# Patient Record
Sex: Male | Born: 1980 | Race: White | Hispanic: No | Marital: Married | Smoking: Never smoker
Health system: Southern US, Community
[De-identification: ages and names within clinical notes are randomized; demographics above are authoritative.]

---

## 2008-10-18 ENCOUNTER — Ambulatory Visit (HOSPITAL_COMMUNITY): Admission: RE | Admit: 2008-10-18 | Discharge: 2008-10-18 | Payer: Self-pay | Admitting: Surgery

## 2008-10-21 ENCOUNTER — Encounter: Admission: RE | Admit: 2008-10-21 | Discharge: 2009-01-19 | Payer: Self-pay | Admitting: Surgery

## 2008-10-25 ENCOUNTER — Ambulatory Visit (HOSPITAL_COMMUNITY): Admission: RE | Admit: 2008-10-25 | Discharge: 2008-10-25 | Payer: Self-pay | Admitting: Surgery

## 2009-03-27 ENCOUNTER — Encounter: Admission: RE | Admit: 2009-03-27 | Discharge: 2009-05-07 | Payer: Self-pay | Admitting: Surgery

## 2009-04-09 HISTORY — PX: LAPAROSCOPIC GASTRIC BANDING: SHX1100

## 2009-04-21 ENCOUNTER — Ambulatory Visit (HOSPITAL_COMMUNITY): Admission: RE | Admit: 2009-04-21 | Discharge: 2009-04-21 | Payer: Self-pay | Admitting: Surgery

## 2009-06-05 ENCOUNTER — Encounter: Admission: RE | Admit: 2009-06-05 | Discharge: 2009-09-03 | Payer: Self-pay | Admitting: Surgery

## 2010-08-11 LAB — DIFFERENTIAL
Basophils Relative: 1 % (ref 0–1)
Eosinophils Absolute: 0.1 10*3/uL (ref 0.0–0.7)
Monocytes Absolute: 0.5 10*3/uL (ref 0.1–1.0)
Monocytes Relative: 6 % (ref 3–12)
Neutrophils Relative %: 66 % (ref 43–77)

## 2010-08-11 LAB — COMPREHENSIVE METABOLIC PANEL
Albumin: 3.8 g/dL (ref 3.5–5.2)
Alkaline Phosphatase: 69 U/L (ref 39–117)
BUN: 11 mg/dL (ref 6–23)
Chloride: 101 mEq/L (ref 96–112)
Potassium: 4.6 mEq/L (ref 3.5–5.1)
Total Bilirubin: 1.1 mg/dL (ref 0.3–1.2)

## 2010-08-11 LAB — CBC
Hemoglobin: 14.2 g/dL (ref 13.0–17.0)
RBC: 5 MIL/uL (ref 4.22–5.81)
RDW: 14.5 % (ref 11.5–15.5)
WBC: 8.6 10*3/uL (ref 4.0–10.5)

## 2010-10-06 ENCOUNTER — Encounter (INDEPENDENT_AMBULATORY_CARE_PROVIDER_SITE_OTHER): Payer: Self-pay | Admitting: Surgery

## 2010-12-18 ENCOUNTER — Encounter (INDEPENDENT_AMBULATORY_CARE_PROVIDER_SITE_OTHER): Payer: Self-pay

## 2010-12-18 ENCOUNTER — Ambulatory Visit (INDEPENDENT_AMBULATORY_CARE_PROVIDER_SITE_OTHER): Payer: Managed Care, Other (non HMO) | Admitting: Physician Assistant

## 2010-12-18 VITALS — BP 140/90 | Ht 72.0 in | Wt 367.2 lb

## 2010-12-18 DIAGNOSIS — Z4651 Encounter for fitting and adjustment of gastric lap band: Secondary | ICD-10-CM

## 2010-12-18 NOTE — Progress Notes (Signed)
  HISTORY: Izear Pine is a 30 y.o.male who received an AP-Large lap-band in December 2010 by Dr. Daphine Deutscher. He comes in with a one month history of intolerance of solid food unless he drinks a hot liquid immediately beforehand. He's also begun gaining weight during that time. No issues previously.Marland Kitchen  VITAL SIGNS: Filed Vitals:   12/18/10 1336  BP: 140/90    PHYSICAL EXAM: Physical exam reveals a very well-appearing 30 y.o.male in no apparent distress Neurologic: Awake, alert, oriented Psych: Bright affect, conversant Respiratory: Breathing even and unlabored. No stridor or wheezing Abdomen: Soft, nontender, nondistended to palpation. Incisions well-healed. No incisional hernias. Port easily palpated. Extremities: Atraumatic, good range of motion.  ASSESMENT: 30 y.o.  male  s/p AP-Large lap-band.   PLAN: I removed 0.5 mL from the band in the hopes that this will alleviate his symptoms. We'll have him back in 1-2 months or sooner if necessary.

## 2010-12-18 NOTE — Patient Instructions (Signed)
Return in 1-2 months or sooner if necessary.

## 2012-03-02 ENCOUNTER — Ambulatory Visit (INDEPENDENT_AMBULATORY_CARE_PROVIDER_SITE_OTHER): Payer: Managed Care, Other (non HMO) | Admitting: Surgery

## 2012-03-02 ENCOUNTER — Ambulatory Visit
Admission: RE | Admit: 2012-03-02 | Discharge: 2012-03-02 | Disposition: A | Discharge status: Home / Self Care | Payer: Managed Care, Other (non HMO) | Source: Ambulatory Visit | Attending: Surgery | Admitting: Surgery

## 2012-03-02 ENCOUNTER — Encounter (INDEPENDENT_AMBULATORY_CARE_PROVIDER_SITE_OTHER): Payer: Self-pay | Admitting: Surgery

## 2012-03-02 ENCOUNTER — Telehealth (INDEPENDENT_AMBULATORY_CARE_PROVIDER_SITE_OTHER): Payer: Self-pay | Admitting: General Surgery

## 2012-03-02 VITALS — BP 126/84 | HR 76 | Temp 97.7°F | Resp 18 | Ht 73.0 in | Wt 376.2 lb

## 2012-03-02 DIAGNOSIS — N63 Unspecified lump in unspecified breast: Secondary | ICD-10-CM

## 2012-03-02 DIAGNOSIS — Z9884 Bariatric surgery status: Secondary | ICD-10-CM

## 2012-03-02 DIAGNOSIS — R0602 Shortness of breath: Secondary | ICD-10-CM

## 2012-03-02 DIAGNOSIS — R1310 Dysphagia, unspecified: Secondary | ICD-10-CM

## 2012-03-02 NOTE — Progress Notes (Signed)
Devin Brennan 31 y.o.  Body mass index is 49.63 kg/(m^2).  Patient Active Problem List  Diagnosis  . 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 Laparoscopic gastric banding 04/2009   CAMERON,JOHN, MD 1. Lapband APL Dec 2010     [name] comes in today with a year long history of intermittent history of migratory chest pain with SOB.  He also has a palpable mass in his left breast medially at the 9 oclock position.  He is 38 lbs down from his preop weight and says that he has periods of obstructon and periods when he can eat ad lib.   I worry about DVT and occult PEs.  Will get venous doppler study.  Will check mammogram to assess the mass int he left breast and will get an upper GI to assess his lapband.  Will see back after these.  Matt B. Daphine Deutscher, MD, [facility] [phone] beeper [phone]  03/02/2012 9:58 AM

## 2012-03-02 NOTE — Patient Instructions (Signed)
Thanks for your patience.  If you need further assistance after leaving the office, please call our office and speak with a [facility] nurse.  [phone].  If you want to leave a message for Dr. Teona Vargus, please call his office phone at [phone]. 

## 2012-03-02 NOTE — Telephone Encounter (Signed)
Doppler is negative.

## 2012-03-07 ENCOUNTER — Ambulatory Visit
Admission: RE | Admit: 2012-03-07 | Discharge: 2012-03-07 | Disposition: A | Discharge status: Home / Self Care | Payer: Private Health Insurance - Indemnity | Source: Ambulatory Visit | Attending: Surgery | Admitting: Surgery

## 2012-03-07 DIAGNOSIS — N63 Unspecified lump in unspecified breast: Secondary | ICD-10-CM

## 2012-03-07 DIAGNOSIS — Z9884 Bariatric surgery status: Secondary | ICD-10-CM

## 2012-03-10 ENCOUNTER — Ambulatory Visit
Admission: RE | Admit: 2012-03-10 | Discharge: 2012-03-10 | Disposition: A | Discharge status: Home / Self Care | Payer: Private Health Insurance - Indemnity | Source: Ambulatory Visit | Attending: Surgery | Admitting: Surgery

## 2012-03-10 DIAGNOSIS — R1310 Dysphagia, unspecified: Secondary | ICD-10-CM

## 2012-03-10 DIAGNOSIS — Z9884 Bariatric surgery status: Secondary | ICD-10-CM

## 2012-03-10 DIAGNOSIS — R0602 Shortness of breath: Secondary | ICD-10-CM

## 2012-03-16 ENCOUNTER — Encounter (INDEPENDENT_AMBULATORY_CARE_PROVIDER_SITE_OTHER): Payer: Managed Care, Other (non HMO) | Admitting: Surgery

## 2012-04-28 ENCOUNTER — Encounter (INDEPENDENT_AMBULATORY_CARE_PROVIDER_SITE_OTHER): Payer: Self-pay | Admitting: Surgery

## 2012-05-25 ENCOUNTER — Encounter (INDEPENDENT_AMBULATORY_CARE_PROVIDER_SITE_OTHER): Payer: Self-pay

## 2012-05-25 ENCOUNTER — Ambulatory Visit (INDEPENDENT_AMBULATORY_CARE_PROVIDER_SITE_OTHER): Payer: Managed Care, Other (non HMO) | Admitting: Physician Assistant

## 2012-05-25 VITALS — BP 130/84 | HR 85 | Temp 97.6°F | Ht 72.0 in | Wt 375.0 lb

## 2012-05-25 DIAGNOSIS — Z4651 Encounter for fitting and adjustment of gastric lap band: Secondary | ICD-10-CM

## 2012-05-25 NOTE — Patient Instructions (Signed)
Focus on good food choices as well as physical activity. Return sooner if you have an increase in hunger, portion sizes or weight. Return also for difficulty swallowing, night cough, reflux.   

## 2012-05-25 NOTE — Progress Notes (Signed)
  HISTORY: Devin Brennan is a 32 y.o.male who received an AP-Large lap-band in December 2010 by Dr. Daphine Deutscher. He comes in with complaints of persistent left sided chest wall pain. He's been seen multiple times by multiple providers for this complaint which started over one year ago, according to him. Thus far any cardiac source has been ruled out. He admits to regurgitation on a daily basis and wonders if this is contributing to the problem. He's asking to have some fluid removed to alleviate his regurgitation issue and to see if his chest wall pain improves.  VITAL SIGNS: Filed Vitals:   05/25/12 0852  BP: 130/84  Pulse: 85  Temp: 97.6 F (36.4 C)    PHYSICAL EXAM: Physical exam reveals a very well-appearing 31 y.o.male in no apparent distress Neurologic: Awake, alert, oriented Psych: Bright affect, conversant Respiratory: Breathing even and unlabored. No stridor or wheezing Abdomen: Soft, nontender, nondistended to palpation. Incisions well-healed. No incisional hernias. Port easily palpated. Extremities: Atraumatic, good range of motion.  ASSESMENT: 32 y.o.  male  s/p AP-Large lap-band.   PLAN: The patient's port was accessed with a 20G Huber needle without difficulty. Clear fluid was aspirated and 1 mL saline was removed from the port. He's scheduled to see his primary care physician in one week. He requested a muscular test to further evaluate his chest wall pain but I'm not certain what examination would be of any diagnostic benefit. As this has been longstanding and any serious cardiac/pulmonary source has been thus far ruled out, I requested that he bring this up with his PCP for further evaluation. He voiced understanding and agreement.

## 2013-10-27 IMAGING — MG MM DIAGNOSTIC BILATERAL
8 series · 8 of 8 positions shown · non-contrast
Comparison: None.

CLINICAL DATA: Patient presents for a bilateral diagnostic
evaluation due to a palpable abnormality of the inner left breast
for 6 months.  The patient states this is unchanged over the last 6
months.

DIGITAL DIAGNOSTIC BILATERAL MAMMOGRAM WITH CAD AND LEFT BREAST
ULTRASOUND:

[R CC (1 of 3)]
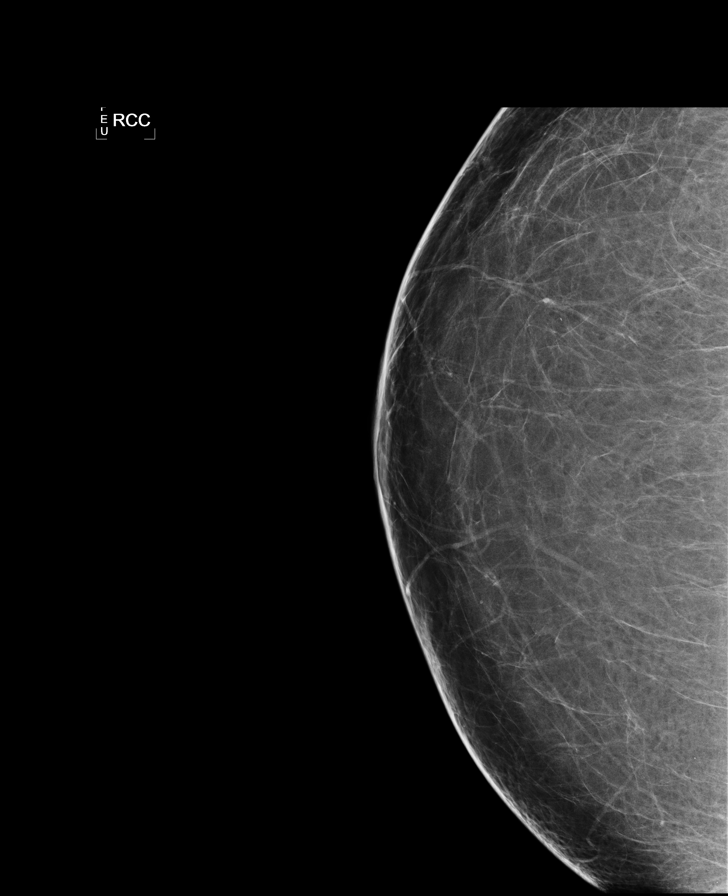

[L CC (1 of 2)]
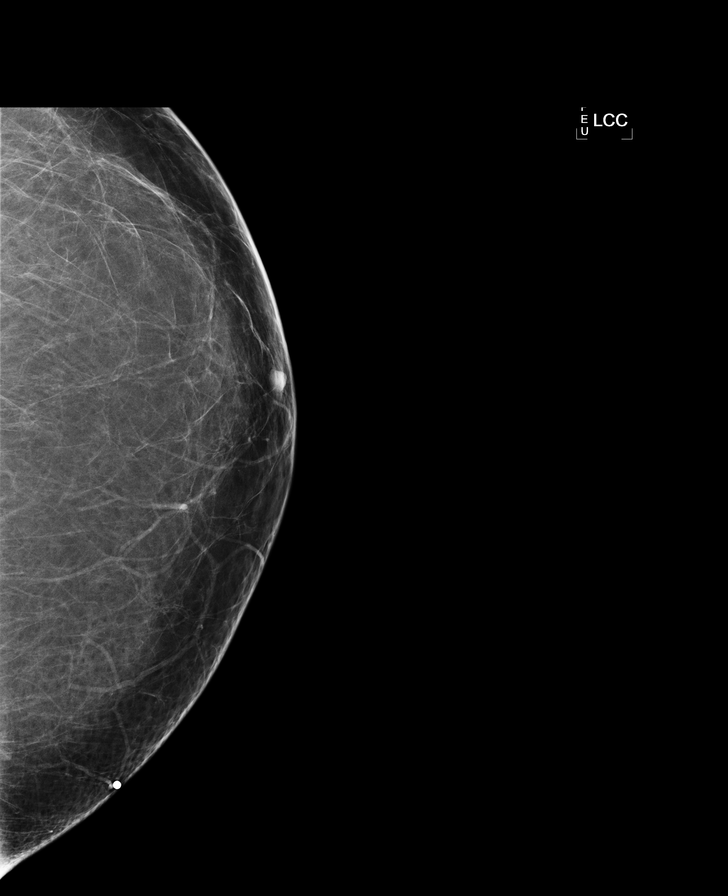

[L CC (2 of 2)]
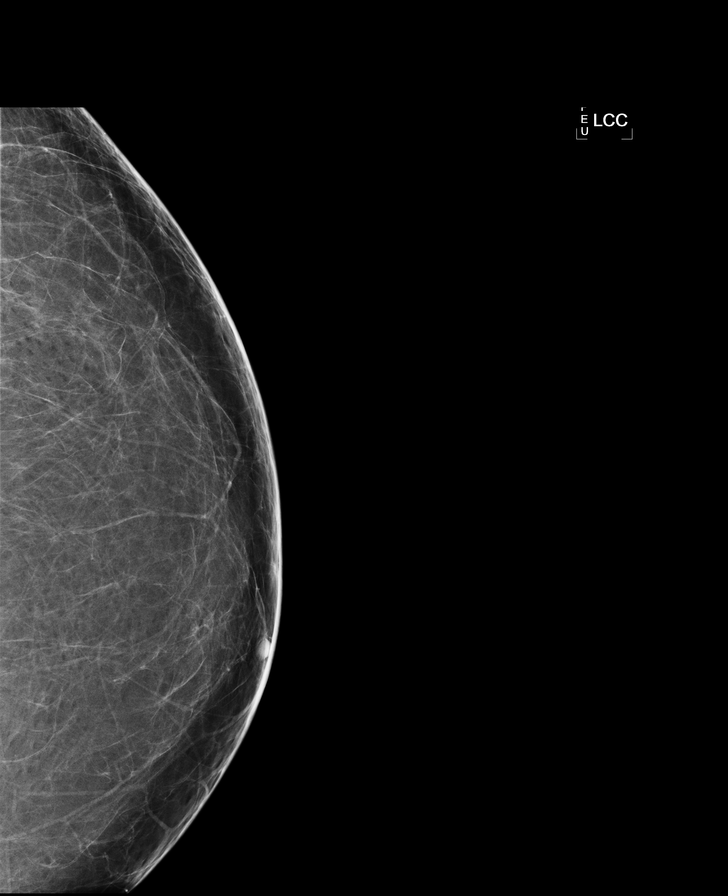

[L MLO]
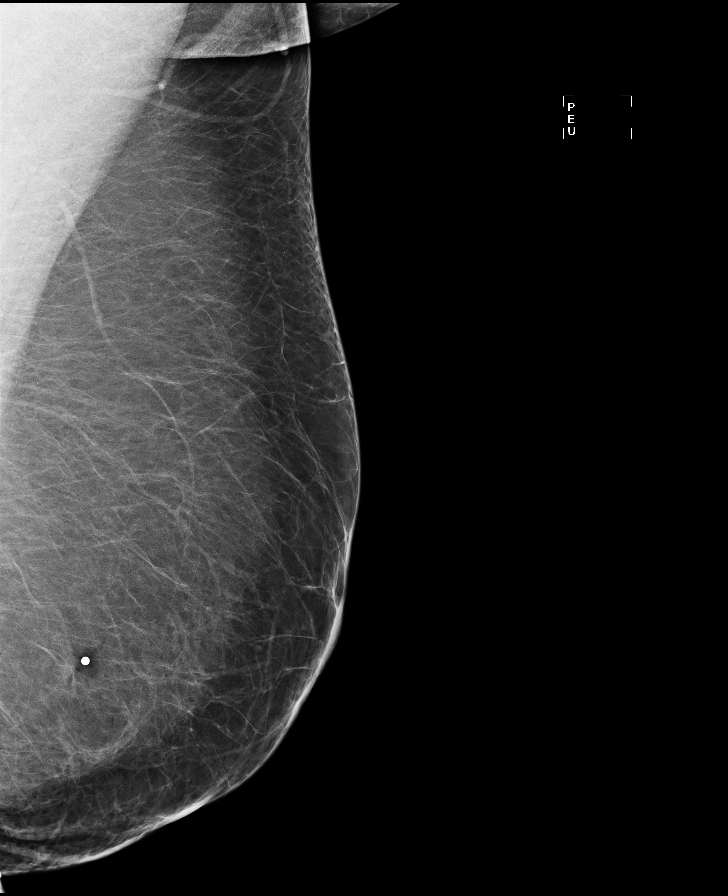

[R CC (2 of 3)]
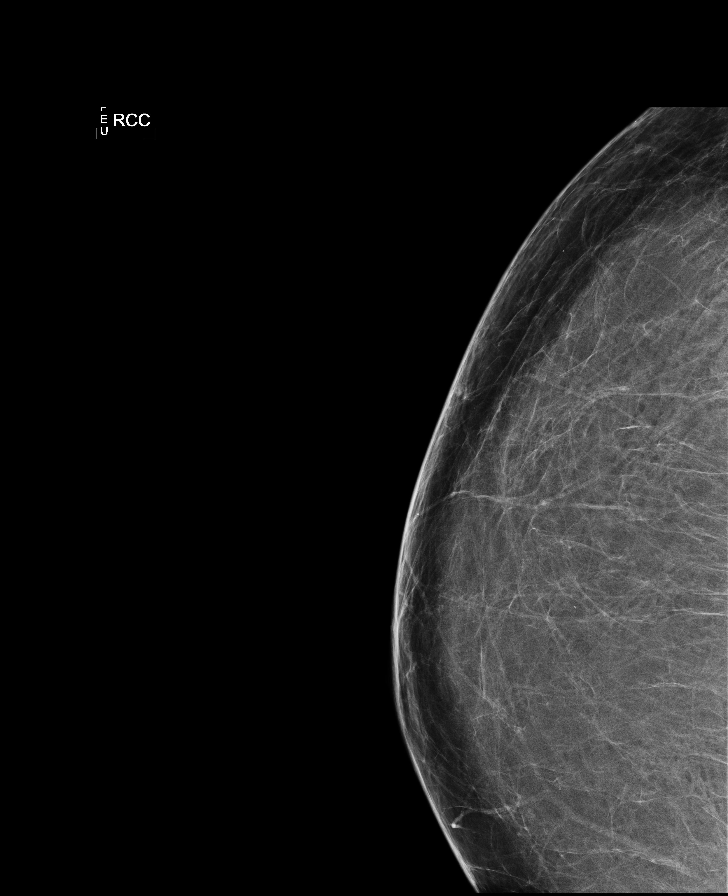

[R MLO]
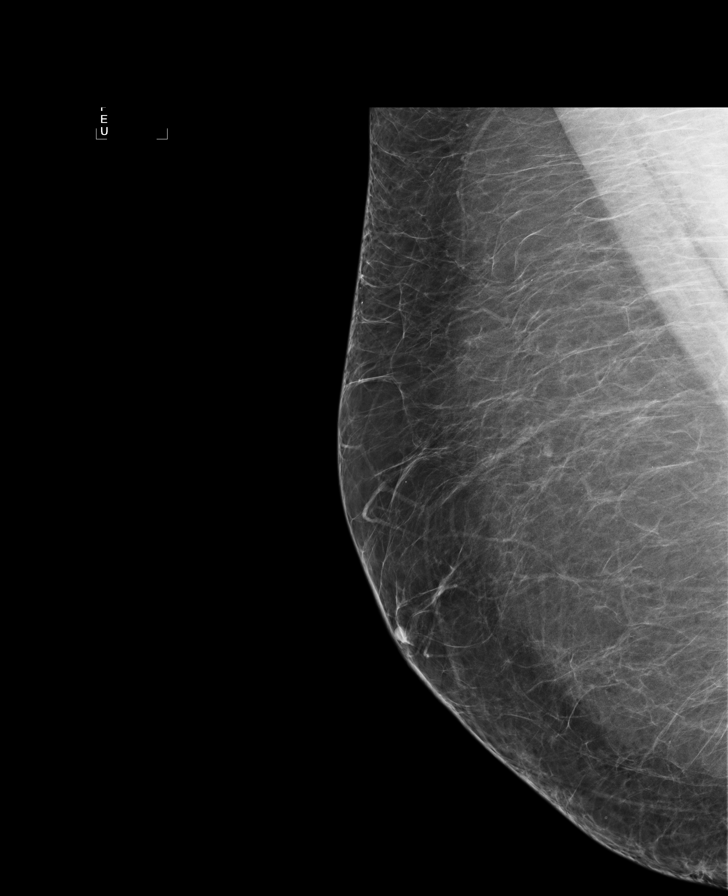

[R CC (3 of 3)]
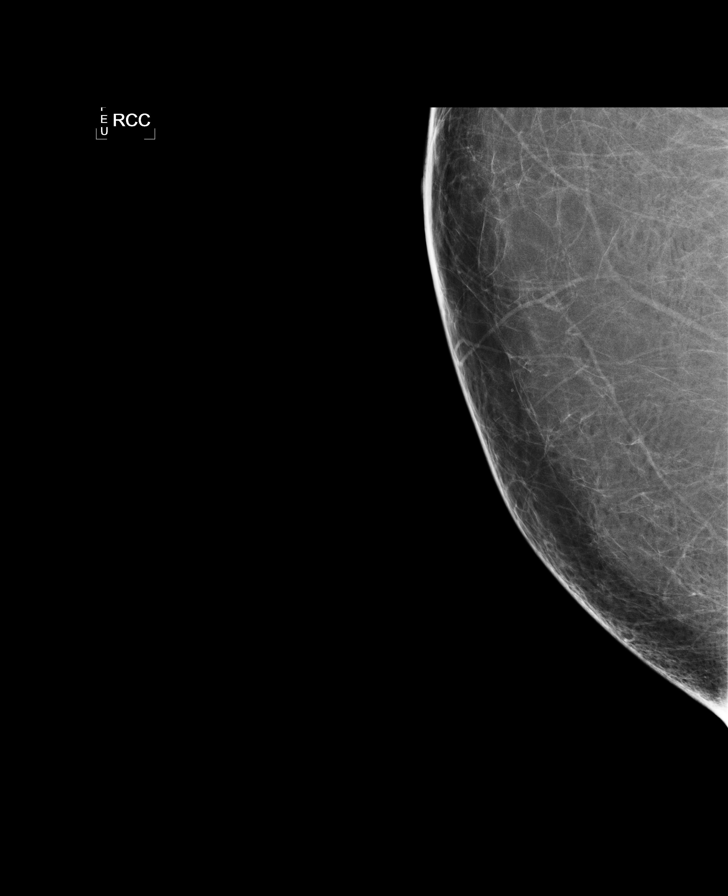

[L TAN]
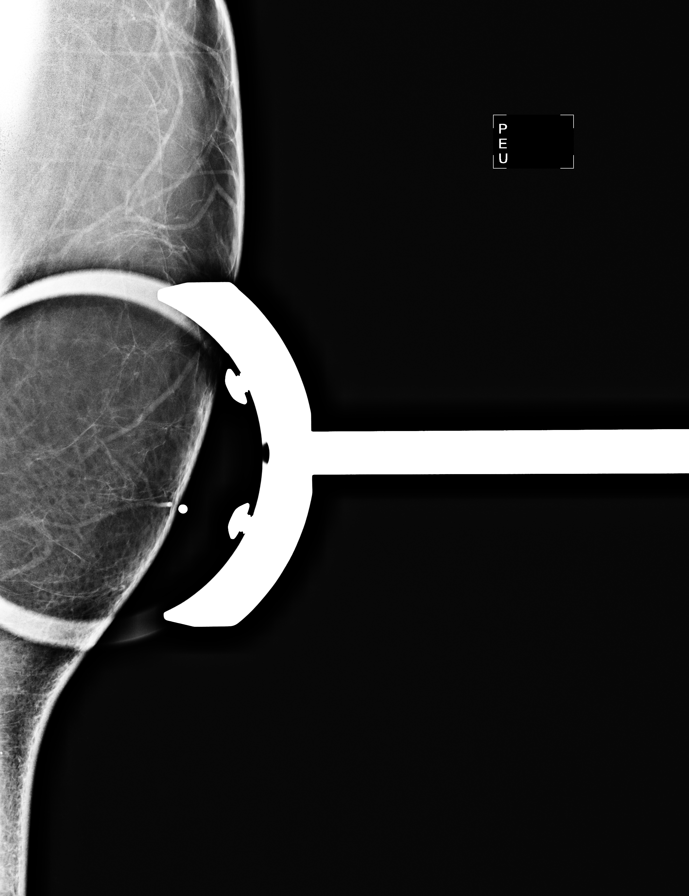

[8 of 8 positions shown; findings below may reference images not displayed]

FINDINGS: Exam demonstrates homogeneous fatty tissue bilaterally.
There is no focal abnormality over the inner left breast to
correspond to patient's palpable abnormality.
Mammographic images were processed with CAD.

On physical exam, I palpate no definite focal abnormality over the
inner left breast at approximately the 9 o'clock position towards
the sternum.

Ultrasound is performed, showing no focal abnormality over the
inner left breast corresponding to patient's palpable abnormality.
IMPRESSION: No focal abnormality over the inner left breast corresponding to
patient's palpable abnormality.

RECOMMENDATION:
Recommend continued management of patient's palpable abnormality on
a clinical basis.  The patient was instructed to return if this
palpable abnormality enlarges.

I have discussed the findings and recommendations with the patient.
Results were also provided in writing at the conclusion of the
visit.

BI-RADS CATEGORY 1:  Negative.

## 2015-05-31 ENCOUNTER — Telehealth: Payer: Self-pay | Admitting: General Surgery

## 2015-05-31 NOTE — Telephone Encounter (Signed)
Pt is a 35 y/o M s/p Lap Band placement by Dr. Daphine Deutscher December 2010.  Pt calls stating he is having some intraabd pain after eating solids.  No issues with liquids.  No nausea or dysphagia.  Pt states he has had abd films at GI doc, but unable to see on Epic.  Pt will be seen in clinic Monday PM.  I d/w the pt if he con't with pain, dysphagia and n/v to proceed to the ED for further eval. A note was sent to Clinic Staff to place into [facility] Monday PM.

## 2015-06-06 ENCOUNTER — Telehealth: Payer: Self-pay | Admitting: Interventional Cardiology

## 2015-06-06 NOTE — Telephone Encounter (Signed)
Continued .Marland Kitchen  Pt proceeded to call me a F*&%$in B%$#*. I then told him that at that time the call would be terminated. The call was then terminated

## 2015-07-17 ENCOUNTER — Ambulatory Visit: Payer: Managed Care, Other (non HMO) | Admitting: Interventional Cardiology

## 2016-11-25 ENCOUNTER — Encounter (HOSPITAL_COMMUNITY): Payer: Self-pay
